# Patient Record
Sex: Female | Born: 1985 | Race: Black or African American | Hispanic: No | Marital: Single | Smoking: Never smoker
Health system: Southern US, Community
[De-identification: ages and names within clinical notes are randomized; demographics above are authoritative.]

## PROBLEM LIST (undated history)

## (undated) DIAGNOSIS — R569 Unspecified convulsions: Secondary | ICD-10-CM

## (undated) HISTORY — PX: OTHER SURGICAL HISTORY: SHX169

---
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
## 2007-08-31 ENCOUNTER — Inpatient Hospital Stay (HOSPITAL_COMMUNITY): Admission: EM | Admit: 2007-08-31 | Discharge: 2007-09-01 | Payer: Self-pay | Admitting: Emergency Medicine

## 2007-08-31 ENCOUNTER — Ambulatory Visit: Payer: Self-pay | Admitting: Family Medicine

## 2008-08-17 ENCOUNTER — Emergency Department (HOSPITAL_COMMUNITY): Admission: EM | Admit: 2008-08-17 | Discharge: 2008-08-17 | Payer: Self-pay | Admitting: Emergency Medicine

## 2008-08-18 ENCOUNTER — Emergency Department (HOSPITAL_COMMUNITY): Admission: EM | Admit: 2008-08-18 | Discharge: 2008-08-18 | Payer: Self-pay | Admitting: Emergency Medicine

## 2008-10-02 ENCOUNTER — Emergency Department (HOSPITAL_COMMUNITY): Admission: EM | Admit: 2008-10-02 | Discharge: 2008-10-02 | Payer: Self-pay | Admitting: Emergency Medicine

## 2008-10-19 ENCOUNTER — Emergency Department (HOSPITAL_COMMUNITY): Admission: EM | Admit: 2008-10-19 | Discharge: 2008-10-19 | Payer: Self-pay | Admitting: Emergency Medicine

## 2009-01-04 ENCOUNTER — Inpatient Hospital Stay (HOSPITAL_COMMUNITY): Admission: EM | Admit: 2009-01-04 | Discharge: 2009-01-07 | Payer: Self-pay | Admitting: Emergency Medicine

## 2009-03-25 ENCOUNTER — Emergency Department (HOSPITAL_COMMUNITY): Admission: EM | Admit: 2009-03-25 | Discharge: 2009-03-25 | Payer: Self-pay | Admitting: Emergency Medicine

## 2009-06-09 ENCOUNTER — Emergency Department (HOSPITAL_COMMUNITY): Admission: EM | Admit: 2009-06-09 | Discharge: 2009-06-09 | Payer: Self-pay | Admitting: Emergency Medicine

## 2009-12-09 ENCOUNTER — Emergency Department (HOSPITAL_COMMUNITY): Admission: EM | Admit: 2009-12-09 | Discharge: 2009-04-16 | Payer: Self-pay | Admitting: Emergency Medicine

## 2010-01-23 ENCOUNTER — Encounter: Payer: Self-pay | Admitting: Internal Medicine

## 2010-03-20 LAB — DIFFERENTIAL
Basophils Absolute: 0 10*3/uL (ref 0.0–0.1)
Basophils Relative: 1 % (ref 0–1)
Eosinophils Relative: 2 % (ref 0–5)
Lymphocytes Relative: 32 % (ref 12–46)
Monocytes Absolute: 0.6 10*3/uL (ref 0.1–1.0)
Monocytes Relative: 12 % (ref 3–12)

## 2010-03-20 LAB — URINALYSIS, ROUTINE W REFLEX MICROSCOPIC
Bilirubin Urine: NEGATIVE
Glucose, UA: NEGATIVE mg/dL
Ketones, ur: 15 mg/dL — AB
Nitrite: NEGATIVE
Specific Gravity, Urine: 1.027 (ref 1.005–1.030)
pH: 6.5 (ref 5.0–8.0)

## 2010-03-20 LAB — CBC
HCT: 31.3 % — ABNORMAL LOW (ref 36.0–46.0)
MCHC: 33.3 g/dL (ref 30.0–36.0)
MCV: 90.7 fL (ref 78.0–100.0)
Platelets: 249 10*3/uL (ref 150–400)
RBC: 3.45 MIL/uL — ABNORMAL LOW (ref 3.87–5.11)
RDW: 13.8 % (ref 11.5–15.5)
WBC: 5.1 10*3/uL (ref 4.0–10.5)

## 2010-03-20 LAB — URINE MICROSCOPIC-ADD ON

## 2010-03-20 LAB — BASIC METABOLIC PANEL
BUN: 9 mg/dL (ref 6–23)
CO2: 26 mEq/L (ref 19–32)
Calcium: 8.5 mg/dL (ref 8.4–10.5)
Creatinine, Ser: 0.69 mg/dL (ref 0.4–1.2)
GFR calc Af Amer: >60 mL/min (ref >60)
Glucose, Bld: 91 mg/dL (ref 70–99)
Potassium: 3.7 mEq/L (ref 3.5–5.1)
Sodium: 135 mEq/L (ref 135–145)

## 2010-03-20 LAB — RAPID URINE DRUG SCREEN, HOSP PERFORMED: Tetrahydrocannabinol: POSITIVE — AB

## 2010-03-20 LAB — SEDIMENTATION RATE: Sed Rate: 11 mm/hr (ref 0–22)

## 2010-03-20 LAB — COMPREHENSIVE METABOLIC PANEL
Alkaline Phosphatase: 33 U/L — ABNORMAL LOW (ref 39–117)
CO2: 29 mEq/L (ref 19–32)
Calcium: 8.8 mg/dL (ref 8.4–10.5)
Chloride: 100 mEq/L (ref 96–112)
GFR calc Af Amer: >60 mL/min (ref >60)
GFR calc non Af Amer: >60 mL/min (ref >60)
Glucose, Bld: 61 mg/dL — ABNORMAL LOW (ref 70–99)
Total Bilirubin: 0.9 mg/dL (ref 0.3–1.2)

## 2010-03-20 LAB — PHOSPHORUS: Phosphorus: 3.7 mg/dL (ref 2.3–4.6)

## 2010-03-21 LAB — POCT PREGNANCY, URINE: Preg Test, Ur: NEGATIVE

## 2010-03-21 LAB — POCT I-STAT, CHEM 8
HCT: 32 % — ABNORMAL LOW (ref 36.0–46.0)
Hemoglobin: 10.9 g/dL — ABNORMAL LOW (ref 12.0–15.0)
Potassium: 3.7 mEq/L (ref 3.5–5.1)

## 2010-03-22 LAB — POCT I-STAT, CHEM 8
HCT: 33 % — ABNORMAL LOW (ref 36.0–46.0)
Hemoglobin: 11.2 g/dL — ABNORMAL LOW (ref 12.0–15.0)
Sodium: 138 mEq/L (ref 135–145)

## 2010-03-22 LAB — GLUCOSE, CAPILLARY: Glucose-Capillary: 90 mg/dL (ref 70–99)

## 2010-05-17 NOTE — Discharge Summary (Signed)
Sarah Olsen, Sarah Olsen NO.:  [id number]   MEDICAL RECORD NO.:  [id number]          PATIENT TYPE:  INP   LOCATION:  5118                         FACILITY:  [facility]   PHYSICIAN:  Nestor Ramp, MD        DATE OF BIRTH:  February 19, 1985   DATE OF ADMISSION:  08/31/2007  DATE OF DISCHARGE:  09/01/2007                               DISCHARGE SUMMARY   PRIMARY CARE PHYSICIAN:  Unassigned.   DISCHARGE DIAGNOSIS:  Somatoform disorder.   DISCHARGE MEDICATIONS:  None.   CONSULTS:  ACTT Team.   PROCEDURES:  The patient had a head CT that was negative.  The patient  had a CT angiogram that was also negative.   PERTINENT LABORATORY DATA:  Upon admission, head CT and CT angio as  above.  D-dimer 0.77.  Urinalysis showed positive nitrites, many  bacteria, white blood cells 3-6, with small leuko esterase.  Hemoglobin  was 10.9 and white blood cell count of 8.1.  BMET was all within normal  limits.  Beta-hCG was negative.   BRIEF HOSPITAL COURSE:  This is a 25 year old African American female  with no past medical history who presented with right-sided weakness and  numbness.  Upon admission, she was worked up for possible TIA.  CT of  the head was negative.  The patient was also found to have elevated D-  dimer upon admission of 0.77.  CT angio was negative and showed no PEs.  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  It was noted that she has  undergone recent social stressors that contributed to her symptoms.  The  patient was discussed with ACTT Team and they announced that they were  unable to admit to behavioral health.  On day of discharge, the patient  regained strength in both lower and upper extremities and was able to  ambulate, was coherent and had competence and capacity to decide that  she wanted to go home.  She was offered follow up appointment with [facility]  She was given  our office number and instructed  to call tomorrow when the office opens.  She was also given the freedom  and the opportunity to research and find other practices that would  accept her insurance.  She is aware of this.  She is discharged in  stable condition.   FOLLOWUP:  This patient most likely needs psycho therapy and frequent  office visits for her condition.      Marisue Ivan, MD  Electronically Signed      Nestor Ramp, MD  Electronically Signed    KL/MEDQ  D:  09/01/2007  T:  09/02/2007  Job:  [id number]
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
## 2011-01-30 ENCOUNTER — Emergency Department (HOSPITAL_COMMUNITY)
Admission: EM | Admit: 2011-01-30 | Discharge: 2011-01-30 | Disposition: A | Discharge status: Home / Self Care | Payer: Medicaid Other | Attending: Emergency Medicine | Admitting: Emergency Medicine

## 2011-01-30 ENCOUNTER — Encounter (HOSPITAL_COMMUNITY): Payer: Self-pay | Admitting: Emergency Medicine

## 2011-01-30 DIAGNOSIS — Z79899 Other long term (current) drug therapy: Secondary | ICD-10-CM | POA: Insufficient documentation

## 2011-01-30 DIAGNOSIS — G40909 Epilepsy, unspecified, not intractable, without status epilepticus: Secondary | ICD-10-CM | POA: Insufficient documentation

## 2011-01-30 DIAGNOSIS — L2989 Other pruritus: Secondary | ICD-10-CM | POA: Insufficient documentation

## 2011-01-30 DIAGNOSIS — L298 Other pruritus: Secondary | ICD-10-CM | POA: Insufficient documentation

## 2011-01-30 DIAGNOSIS — R21 Rash and other nonspecific skin eruption: Secondary | ICD-10-CM | POA: Insufficient documentation

## 2011-01-30 HISTORY — DX: Unspecified convulsions: R56.9

## 2011-01-30 MED ORDER — PERMETHRIN 5 % EX CREA: TOPICAL_CREAM | CUTANEOUS | Status: AC

## 2011-01-30 NOTE — ED Notes (Signed)
PT. REPORTS ITCHY RASHES AT NECK, ARMS AND LEGS ONSET YESTERDAY AFTER USING NEW SOAP , RESPIRATIONS UNLABORED.

## 2011-01-30 NOTE — ED Notes (Signed)
Patient currently sitting up in bed; no respiratory or acute distress noted.  Patient updated on plan of care; informed patient that we are waiting on discharge paperwork from EDP.  Patient has no other questions or concerns at this time; will continue to monitor.

## 2011-01-30 NOTE — ED Notes (Signed)
Patient given copy of discharge paperwork; went over discharge instructions with patient.  Instructed patient to apply cream as directed, to repeat dose in 14 days if symptoms do not improve, to follow up with primary care physician, and to return to the ED for new, worsening, or concerning symptoms.

## 2011-01-30 NOTE — ED Notes (Signed)
Patient currently sitting up in bed; no respiratory or acute distress noted; patient given discharge paperwork.  Patient has no questions or concerns at this time; will continue to monitor.

## 2011-01-30 NOTE — ED Provider Notes (Signed)
History     CSN: [id number]  Arrival date & time 01/30/11  4098   First MD Initiated Contact with Patient 01/30/11 [phone]      Chief Complaint  Patient presents with  . Rash     Patient is a 26 y.o. female presenting with rash. The history is provided by the patient.  Rash  This is a new problem. The current episode started yesterday. The problem has been gradually worsening. The problem is associated with nothing. There has been no fever. Affected Location: neck, groin area. The patient is experiencing no pain. The pain has been constant since onset. Associated symptoms include itching.  pt with rash No sob/facial/tongue swelling She used new soap recently No other new exposures No one else at home has these symptoms She reports she may be pregnant, had recent +pregnancy test, denies abd pain/cramping/vag bleeding  Past Medical History  Diagnosis Date  . Seizures     History reviewed. No pertinent past surgical history.  No family history on file.  History  Substance Use Topics  . Smoking status: Never Smoker   . Smokeless tobacco: Not on file  . Alcohol Use: Yes    OB History    Grav Para Term Preterm Abortions TAB SAB Ect Mult Living                  Review of Systems  Constitutional: Negative for fever.  Skin: Positive for itching and rash.    Allergies  Penicillins  Home Medications   Current Outpatient Rx  Name Route Sig Dispense Refill  . DILANTIN PO Oral Take 1 tablet by mouth daily.    Marland Kitchen PERMETHRIN 5 % EX CREA  Apply to affected area once May repeat in 14 days if still have symptoms 60 g 0    BP 109/61  Pulse 69  Temp(Src) 97.4 F (36.3 C) (Oral)  Resp 18  SpO2 100%  LMP 01/24/2011  Physical Exam CONSTITUTIONAL: Well developed/well nourished HEAD AND FACE: Normocephalic/atraumatic EYES: EOMI/PERRL ENMT: Mucous membranes moist, no angioedema NECK: supple no meningeal signs LUNGS:  no apparent distress ABDOMEN: soft, nontender, no  rebound or guarding NEURO: Pt is awake/alert, moves all extremitiesx4 EXTREMITIES: pulses normal, full ROM SKIN: warm, color normal, rash noted to neck and left groin, raised pustules noted without bleeding/drainage, no urticaria PSYCH: no abnormalities of mood noted  ED Course  Procedures     1. Rash     Pt well appearing Suspect this could be scabies Will give elimite, this is pregnancy safe (category B) Also - she thinks she is in early pregnancy  MDM  Nursing notes reviewed and considered in documentation         Joya Gaskins, MD 01/30/11 [phone]

## 2011-01-30 NOTE — ED Notes (Addendum)
Patient complaining of a rash all over her body, especially around her neck and on her hands.  Patient describes rash as "itchy" and "irritating".  Denies pain.  Upon assessment, rash noted along neckline.  Patient states that she feels she has bed bugs and that the foam topper that she took out of storage and placed on her bed is to blame.  Patient alert and oriented x4; PERRL present.  Will continue to monitor.
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
## 2011-12-20 IMAGING — CT CT HEAD W/O CM
1 series · 16 of 30 positions shown, 20 images · non-contrast
Comparison: Head CT 01/04/2009

CLINICAL DATA: Seizure.

CT HEAD WITHOUT CONTRAST
TECHNIQUE: Contiguous axial images were obtained from the base of
the skull through the vertex without contrast.

[Series 2: head_seq 4.5 h37s st · axial · 0.43mm/px · z∈[+993,+1119]mm · 16 of 32 slices shown, 20 images]
[im 2/32  brain]
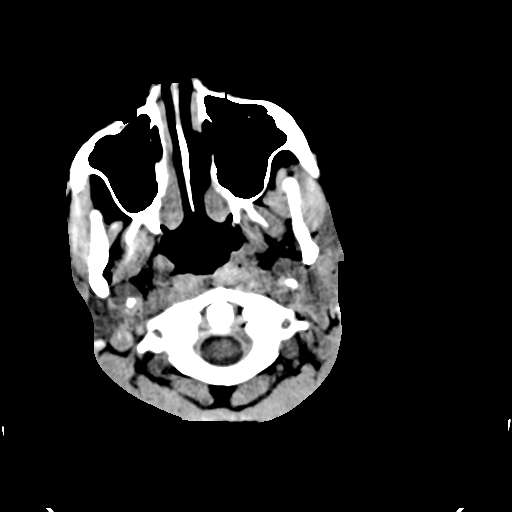
[im 2/32  bone]
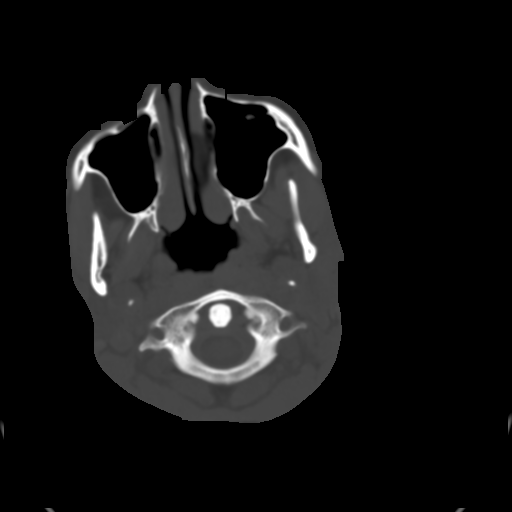
[im 4/32  brain]
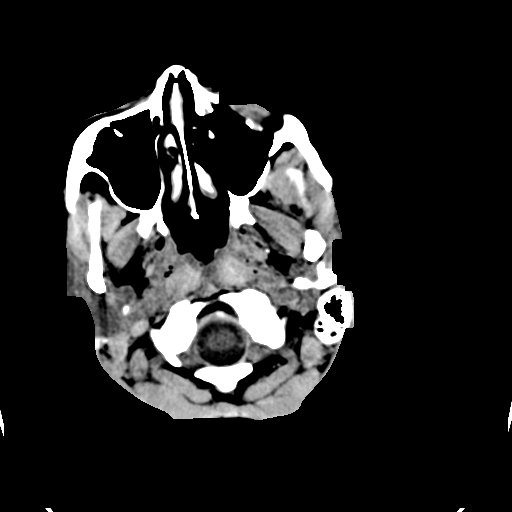
[im 6/32  brain]
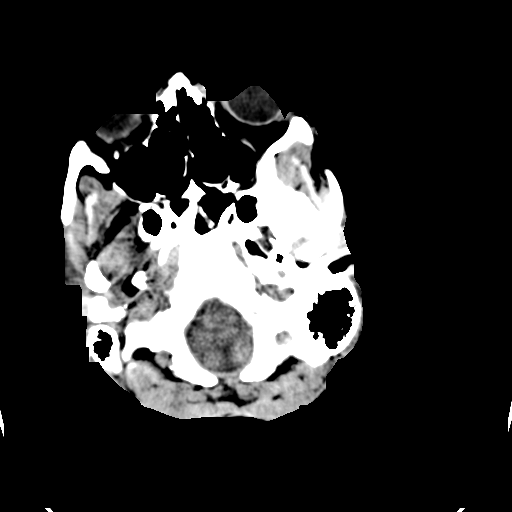
[im 8/32  brain]
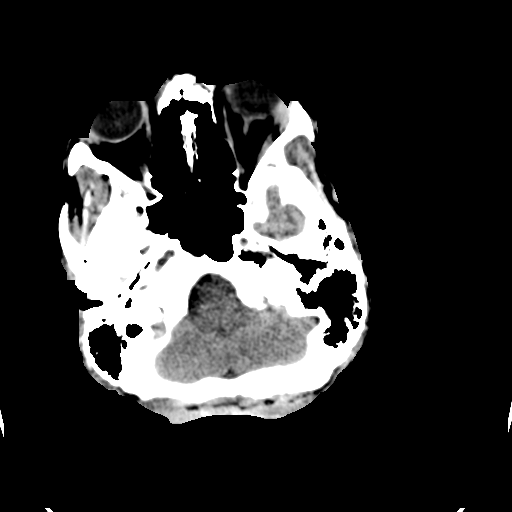
[im 9/32  brain]
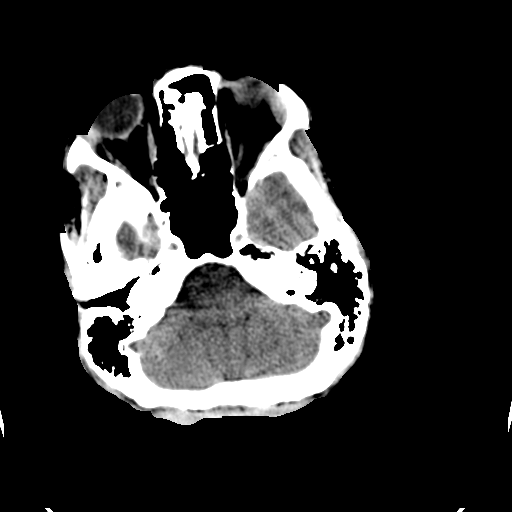
[im 9/32  bone]
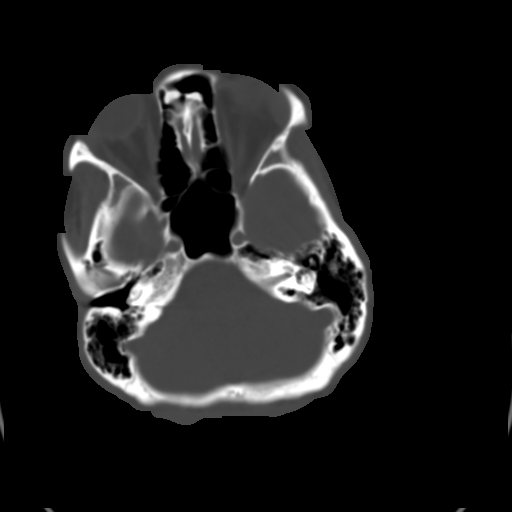
[im 11/32  brain]
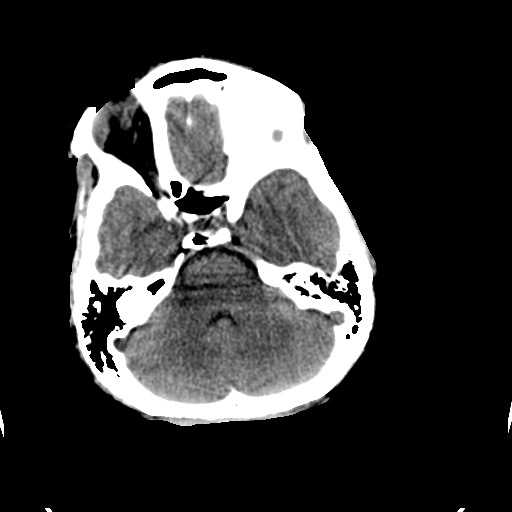
[im 13/32  brain]
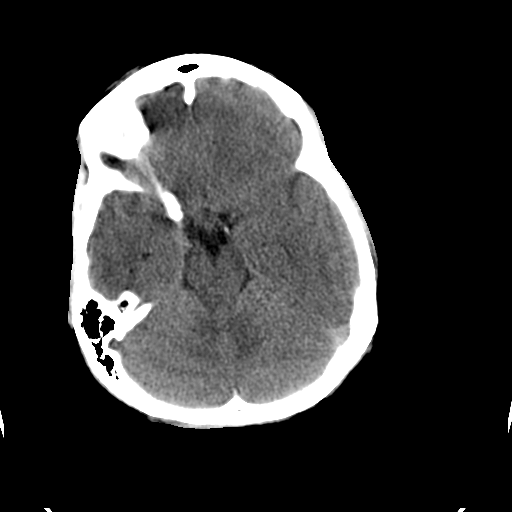
[im 15/32  brain]
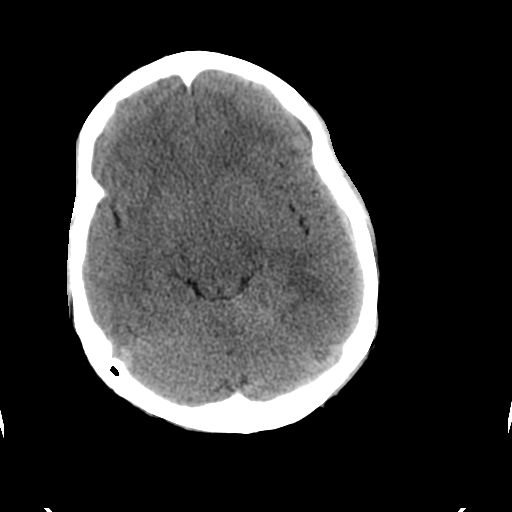
[im 17/32  brain]
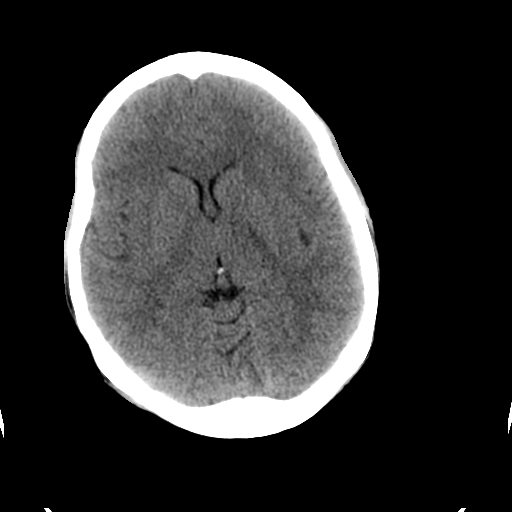
[im 17/32  bone]
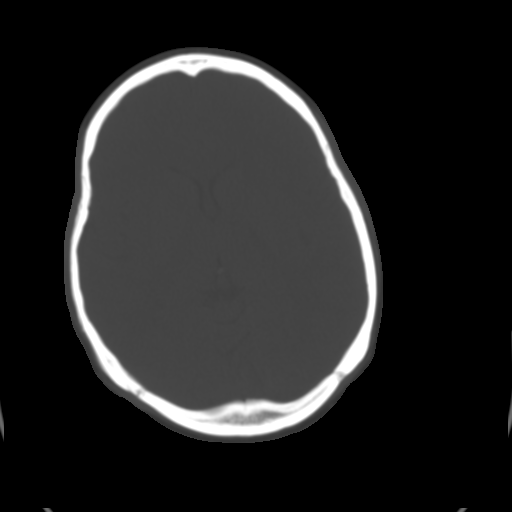
[im 19/32  brain]
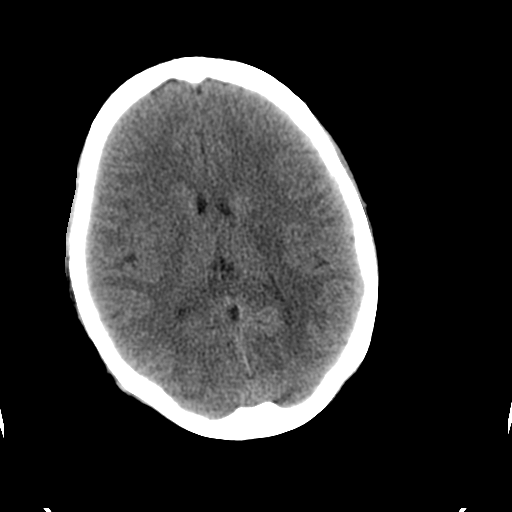
[im 21/32  brain]
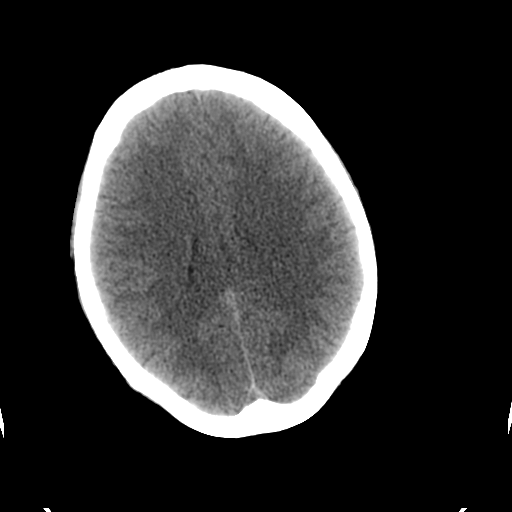
[im 23/32  brain]
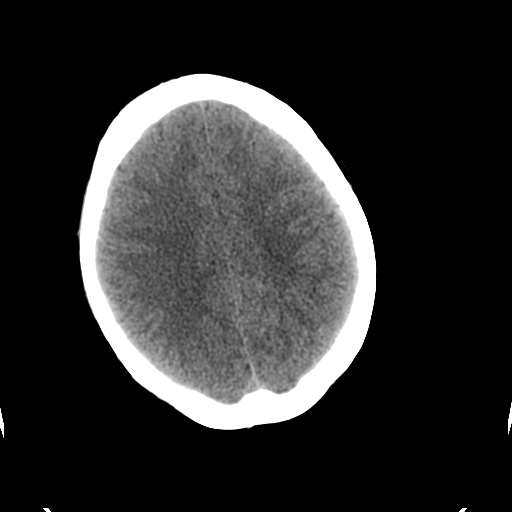
[im 24/32  brain]
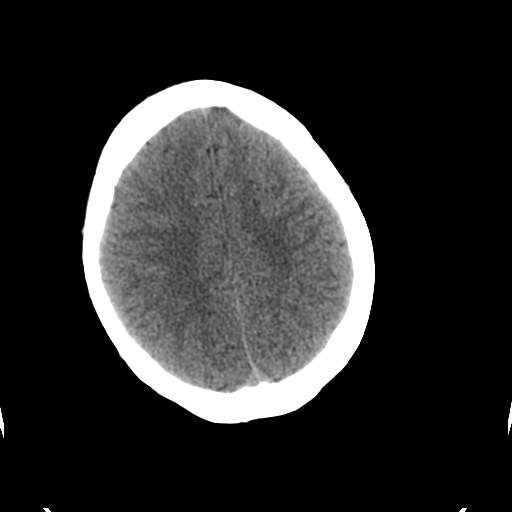
[im 24/32  bone]
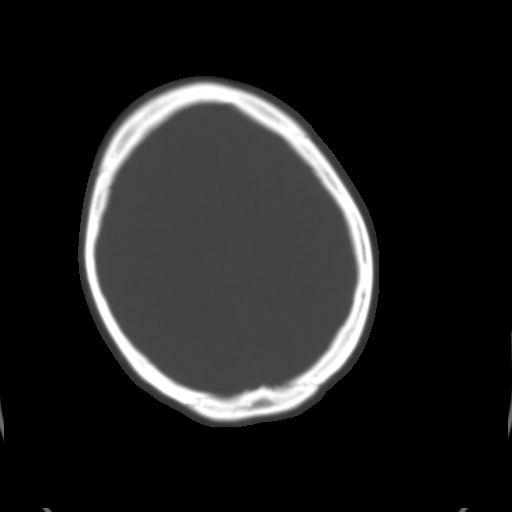
[im 26/32  brain]
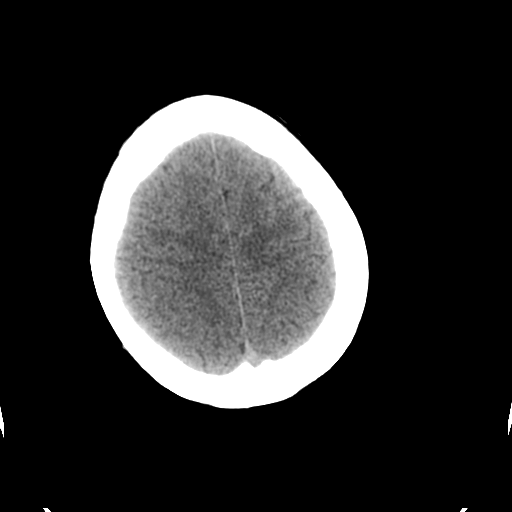
[im 28/32  brain]
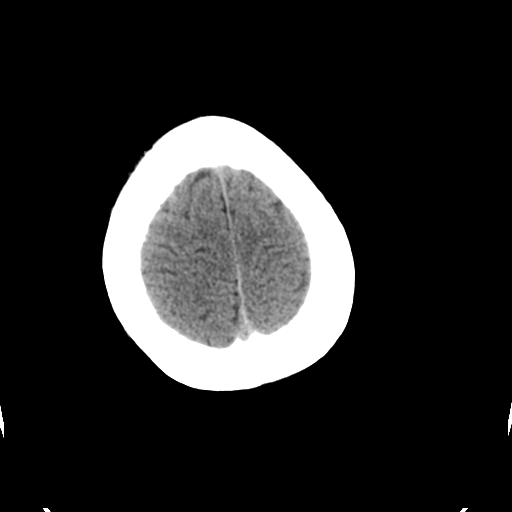
[im 30/32  brain]
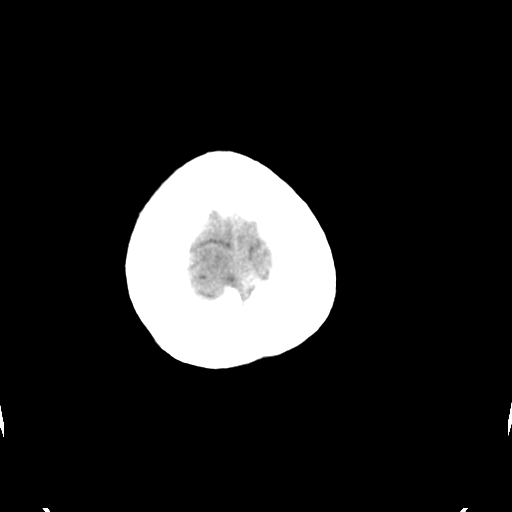

[16 of 30 positions shown; findings below may reference images not displayed]

FINDINGS: The ventricles are normal.  No extra-axial fluid
collections are seen.  The brainstem and cerebellum are
unremarkable.  No acute intracranial findings such as infarction or
hemorrhage.  No mass lesions.

The bony calvarium is intact.  The visualized paranasal sinuses and
mastoid air cells are clear.
IMPRESSION: No acute intracranial findings.  No change since prior study.

## 2013-07-18 ENCOUNTER — Encounter: Payer: Self-pay | Admitting: *Deleted

## 2013-07-22 ENCOUNTER — Ambulatory Visit (INDEPENDENT_AMBULATORY_CARE_PROVIDER_SITE_OTHER): Payer: Medicaid Other | Admitting: Neurology

## 2013-07-22 ENCOUNTER — Other Ambulatory Visit: Payer: Medicaid Other

## 2013-07-22 ENCOUNTER — Encounter: Payer: Self-pay | Admitting: Neurology

## 2013-07-22 VITALS — BP 110/61 | HR 74 | Ht 70.0 in | Wt 121.0 lb

## 2013-07-22 DIAGNOSIS — R569 Unspecified convulsions: Secondary | ICD-10-CM
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
## 2013-07-22 NOTE — Patient Instructions (Signed)
Overall you are doing fairly well but I do want to suggest a few things today:   Remember to drink plenty of fluid, eat healthy meals and do not skip any meals. Try to eat protein with a every meal and eat a healthy snack such as fruit or nuts in between meals. Try to keep a regular sleep-wake schedule and try to exercise daily, particularly in the form of walking, 20-30 minutes a day, if you can.   As far as your medications are concerned, I would like to suggest the following: 1)Please continue on gabapentin 300mg  three times a day 2)Please continue on pamelor 10mg  nightly  As far as diagnostic testing:  1)please schedule an EEG  Please follow up in 6 moths. I will call you with the EEG results. Please call us with any interim questions, concerns, problems, updates or refill requests.   My clinical assistant and will answer any of your questions and relay your messages to me and also relay most of my messages to you.   Our phone number is [phone]. We also have an after hours call service for urgent matters and there is a physician on-call for urgent questions. For any emergencies you know to call 911 or go to the nearest emergency room

## 2013-08-25 ENCOUNTER — Ambulatory Visit (INDEPENDENT_AMBULATORY_CARE_PROVIDER_SITE_OTHER): Payer: Medicaid Other

## 2013-08-25 DIAGNOSIS — R569 Unspecified convulsions: Secondary | ICD-10-CM

## 2013-08-25 NOTE — Procedures (Signed)
    History:  Sarah Olsen is a 28 year old patient with a history of seizures since 2011 associated with episodes of loss of consciousness and generalized shaking. The patient is being evaluated for these events.  This is a routine EEG. No skull defects are noted. Medications include Flexeril, gabapentin, Naprosyn, and Pamelor.   EEG classification: Normal awake  Description of the recording: The background rhythms of this recording consists of a fairly well modulated medium amplitude alpha rhythm of 11 Hz that is reactive to eye opening and closure. As the record progresses, the patient appears to remain in the waking state throughout the recording. Photic stimulation was performed, resulting in a bilateral and symmetric photic driving response. Hyperventilation was also performed, resulting in a minimal buildup of the background rhythm activities without significant slowing seen. At no time during the recording does there appear to be evidence of spike or spike wave discharges or evidence of focal slowing. EKG monitor shows no evidence of cardiac rhythm abnormalities with a heart rate of 66.  Impression: This is a normal EEG recording in the waking state. No evidence of ictal or interictal discharges are seen.
